# Patient Record
Sex: Male | Born: 2000 | Race: White | Hispanic: Yes | Marital: Single | Smoking: Never smoker
Health system: Southern US, Community
[De-identification: ages and names within clinical notes are randomized; demographics above are authoritative.]

## PROBLEM LIST (undated history)

## (undated) HISTORY — PX: HIP SURGERY: SHX245

---
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
## 2019-02-06 ENCOUNTER — Telehealth: Payer: Self-pay | Admitting: Internal Medicine

## 2019-02-06 DIAGNOSIS — Z20822 Contact with and (suspected) exposure to covid-19: Secondary | ICD-10-CM

## 2019-02-06 NOTE — Addendum Note (Signed)
Addended by: Lonia Farber E on: 02/06/2019 12:54 PM   Modules accepted: Orders

## 2019-02-06 NOTE — Telephone Encounter (Addendum)
Received call from [facility] at [facility] Department. Per Shawna Orleans, pt has a exposure to Covid -19 and is symptomatic: sore throat fever Requesting Covid testing for pt. Using [employer] # [id number]. Called patient. Patient stated he called for his mother not for himself.      Via Engineer, structural, spoke with  pt's mother and mother is very ill and [phone] called for her.
